# Patient Record
Sex: Male | Born: 2000 | Race: Black or African American | Hispanic: No | Marital: Single | Smoking: Never smoker
Health system: Southern US, Community
[De-identification: ages and names within clinical notes are randomized; demographics above are authoritative.]

## PROBLEM LIST (undated history)

## (undated) ENCOUNTER — Emergency Department (HOSPITAL_BASED_OUTPATIENT_CLINIC_OR_DEPARTMENT_OTHER): Admission: EM | Payer: Self-pay | Source: Home / Self Care

---
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
## 2019-01-08 DIAGNOSIS — M94261 Chondromalacia, right knee: Secondary | ICD-10-CM | POA: Diagnosis not present

## 2019-01-08 DIAGNOSIS — S83271A Complex tear of lateral meniscus, current injury, right knee, initial encounter: Secondary | ICD-10-CM | POA: Diagnosis not present

## 2019-01-14 DIAGNOSIS — Z4789 Encounter for other orthopedic aftercare: Secondary | ICD-10-CM | POA: Diagnosis not present

## 2019-01-16 DIAGNOSIS — Z4789 Encounter for other orthopedic aftercare: Secondary | ICD-10-CM | POA: Diagnosis not present

## 2019-01-23 DIAGNOSIS — Z4789 Encounter for other orthopedic aftercare: Secondary | ICD-10-CM | POA: Diagnosis not present

## 2019-01-25 DIAGNOSIS — Z4789 Encounter for other orthopedic aftercare: Secondary | ICD-10-CM | POA: Diagnosis not present

## 2019-01-29 DIAGNOSIS — Z4789 Encounter for other orthopedic aftercare: Secondary | ICD-10-CM | POA: Diagnosis not present

## 2019-01-31 DIAGNOSIS — Z4789 Encounter for other orthopedic aftercare: Secondary | ICD-10-CM | POA: Diagnosis not present

## 2019-02-05 DIAGNOSIS — Z4789 Encounter for other orthopedic aftercare: Secondary | ICD-10-CM | POA: Diagnosis not present

## 2019-02-07 DIAGNOSIS — Z4789 Encounter for other orthopedic aftercare: Secondary | ICD-10-CM | POA: Diagnosis not present

## 2019-02-12 DIAGNOSIS — Z4789 Encounter for other orthopedic aftercare: Secondary | ICD-10-CM | POA: Diagnosis not present

## 2019-02-14 DIAGNOSIS — Z4789 Encounter for other orthopedic aftercare: Secondary | ICD-10-CM | POA: Diagnosis not present

## 2019-02-19 DIAGNOSIS — Z4789 Encounter for other orthopedic aftercare: Secondary | ICD-10-CM | POA: Diagnosis not present

## 2019-02-21 DIAGNOSIS — Z4789 Encounter for other orthopedic aftercare: Secondary | ICD-10-CM | POA: Diagnosis not present

## 2019-02-26 DIAGNOSIS — Z4789 Encounter for other orthopedic aftercare: Secondary | ICD-10-CM | POA: Diagnosis not present

## 2019-02-28 DIAGNOSIS — Z4789 Encounter for other orthopedic aftercare: Secondary | ICD-10-CM | POA: Diagnosis not present

## 2019-03-05 DIAGNOSIS — Z4789 Encounter for other orthopedic aftercare: Secondary | ICD-10-CM | POA: Diagnosis not present

## 2019-03-07 DIAGNOSIS — Z4789 Encounter for other orthopedic aftercare: Secondary | ICD-10-CM | POA: Diagnosis not present

## 2019-03-12 DIAGNOSIS — Z4789 Encounter for other orthopedic aftercare: Secondary | ICD-10-CM | POA: Diagnosis not present

## 2019-03-14 DIAGNOSIS — Z4789 Encounter for other orthopedic aftercare: Secondary | ICD-10-CM | POA: Diagnosis not present

## 2019-03-19 DIAGNOSIS — Z4789 Encounter for other orthopedic aftercare: Secondary | ICD-10-CM | POA: Diagnosis not present

## 2019-03-21 DIAGNOSIS — Z4789 Encounter for other orthopedic aftercare: Secondary | ICD-10-CM | POA: Diagnosis not present

## 2019-03-28 DIAGNOSIS — Z4789 Encounter for other orthopedic aftercare: Secondary | ICD-10-CM | POA: Diagnosis not present

## 2019-06-04 DIAGNOSIS — Z1159 Encounter for screening for other viral diseases: Secondary | ICD-10-CM | POA: Diagnosis not present

## 2019-06-04 DIAGNOSIS — Z03818 Encounter for observation for suspected exposure to other biological agents ruled out: Secondary | ICD-10-CM | POA: Diagnosis not present

## 2019-08-05 DIAGNOSIS — Z20828 Contact with and (suspected) exposure to other viral communicable diseases: Secondary | ICD-10-CM | POA: Diagnosis not present

## 2019-08-05 DIAGNOSIS — Z4789 Encounter for other orthopedic aftercare: Secondary | ICD-10-CM | POA: Diagnosis not present

## 2019-08-05 DIAGNOSIS — M25561 Pain in right knee: Secondary | ICD-10-CM | POA: Diagnosis not present

## 2019-08-10 DIAGNOSIS — M25561 Pain in right knee: Secondary | ICD-10-CM | POA: Diagnosis not present

## 2019-08-22 DIAGNOSIS — M25561 Pain in right knee: Secondary | ICD-10-CM | POA: Diagnosis not present

## 2019-09-17 DIAGNOSIS — M23203 Derangement of unspecified medial meniscus due to old tear or injury, right knee: Secondary | ICD-10-CM | POA: Diagnosis not present

## 2019-09-17 DIAGNOSIS — M23200 Derangement of unspecified lateral meniscus due to old tear or injury, right knee: Secondary | ICD-10-CM | POA: Diagnosis not present

## 2019-09-18 DIAGNOSIS — M23203 Derangement of unspecified medial meniscus due to old tear or injury, right knee: Secondary | ICD-10-CM | POA: Diagnosis not present

## 2019-09-18 DIAGNOSIS — M23200 Derangement of unspecified lateral meniscus due to old tear or injury, right knee: Secondary | ICD-10-CM | POA: Diagnosis not present

## 2019-09-23 DIAGNOSIS — M23200 Derangement of unspecified lateral meniscus due to old tear or injury, right knee: Secondary | ICD-10-CM | POA: Diagnosis not present

## 2019-09-23 DIAGNOSIS — M23203 Derangement of unspecified medial meniscus due to old tear or injury, right knee: Secondary | ICD-10-CM | POA: Diagnosis not present

## 2019-09-26 DIAGNOSIS — M23200 Derangement of unspecified lateral meniscus due to old tear or injury, right knee: Secondary | ICD-10-CM | POA: Diagnosis not present

## 2019-09-26 DIAGNOSIS — M23203 Derangement of unspecified medial meniscus due to old tear or injury, right knee: Secondary | ICD-10-CM | POA: Diagnosis not present

## 2019-10-03 DIAGNOSIS — M23203 Derangement of unspecified medial meniscus due to old tear or injury, right knee: Secondary | ICD-10-CM | POA: Diagnosis not present

## 2019-10-03 DIAGNOSIS — M23200 Derangement of unspecified lateral meniscus due to old tear or injury, right knee: Secondary | ICD-10-CM | POA: Diagnosis not present

## 2019-10-07 DIAGNOSIS — M23203 Derangement of unspecified medial meniscus due to old tear or injury, right knee: Secondary | ICD-10-CM | POA: Diagnosis not present

## 2019-10-07 DIAGNOSIS — M23200 Derangement of unspecified lateral meniscus due to old tear or injury, right knee: Secondary | ICD-10-CM | POA: Diagnosis not present

## 2019-10-10 DIAGNOSIS — M23200 Derangement of unspecified lateral meniscus due to old tear or injury, right knee: Secondary | ICD-10-CM | POA: Diagnosis not present

## 2019-10-10 DIAGNOSIS — M23203 Derangement of unspecified medial meniscus due to old tear or injury, right knee: Secondary | ICD-10-CM | POA: Diagnosis not present

## 2019-10-15 DIAGNOSIS — M23203 Derangement of unspecified medial meniscus due to old tear or injury, right knee: Secondary | ICD-10-CM | POA: Diagnosis not present

## 2019-10-15 DIAGNOSIS — M23200 Derangement of unspecified lateral meniscus due to old tear or injury, right knee: Secondary | ICD-10-CM | POA: Diagnosis not present

## 2019-10-17 DIAGNOSIS — M23203 Derangement of unspecified medial meniscus due to old tear or injury, right knee: Secondary | ICD-10-CM | POA: Diagnosis not present

## 2019-10-17 DIAGNOSIS — M23200 Derangement of unspecified lateral meniscus due to old tear or injury, right knee: Secondary | ICD-10-CM | POA: Diagnosis not present

## 2019-10-21 DIAGNOSIS — M23200 Derangement of unspecified lateral meniscus due to old tear or injury, right knee: Secondary | ICD-10-CM | POA: Diagnosis not present

## 2019-10-21 DIAGNOSIS — M23203 Derangement of unspecified medial meniscus due to old tear or injury, right knee: Secondary | ICD-10-CM | POA: Diagnosis not present

## 2019-10-24 DIAGNOSIS — M23200 Derangement of unspecified lateral meniscus due to old tear or injury, right knee: Secondary | ICD-10-CM | POA: Diagnosis not present

## 2019-10-24 DIAGNOSIS — M23203 Derangement of unspecified medial meniscus due to old tear or injury, right knee: Secondary | ICD-10-CM | POA: Diagnosis not present

## 2019-10-28 DIAGNOSIS — M23203 Derangement of unspecified medial meniscus due to old tear or injury, right knee: Secondary | ICD-10-CM | POA: Diagnosis not present

## 2019-10-28 DIAGNOSIS — M23200 Derangement of unspecified lateral meniscus due to old tear or injury, right knee: Secondary | ICD-10-CM | POA: Diagnosis not present

## 2019-11-01 DIAGNOSIS — M23203 Derangement of unspecified medial meniscus due to old tear or injury, right knee: Secondary | ICD-10-CM | POA: Diagnosis not present

## 2019-11-01 DIAGNOSIS — Z23 Encounter for immunization: Secondary | ICD-10-CM | POA: Diagnosis not present

## 2019-11-01 DIAGNOSIS — M23200 Derangement of unspecified lateral meniscus due to old tear or injury, right knee: Secondary | ICD-10-CM | POA: Diagnosis not present

## 2019-11-07 DIAGNOSIS — M23203 Derangement of unspecified medial meniscus due to old tear or injury, right knee: Secondary | ICD-10-CM | POA: Diagnosis not present

## 2019-11-07 DIAGNOSIS — M23200 Derangement of unspecified lateral meniscus due to old tear or injury, right knee: Secondary | ICD-10-CM | POA: Diagnosis not present

## 2019-11-12 ENCOUNTER — Ambulatory Visit: Payer: BC Managed Care – PPO | Attending: Internal Medicine

## 2019-11-12 DIAGNOSIS — M23200 Derangement of unspecified lateral meniscus due to old tear or injury, right knee: Secondary | ICD-10-CM | POA: Diagnosis not present

## 2019-11-12 DIAGNOSIS — Z20822 Contact with and (suspected) exposure to covid-19: Secondary | ICD-10-CM | POA: Insufficient documentation

## 2019-11-12 DIAGNOSIS — M23203 Derangement of unspecified medial meniscus due to old tear or injury, right knee: Secondary | ICD-10-CM | POA: Diagnosis not present

## 2019-11-14 LAB — NOVEL CORONAVIRUS, NAA: SARS-CoV-2, NAA: NOT DETECTED

## 2019-11-30 DIAGNOSIS — Z20822 Contact with and (suspected) exposure to covid-19: Secondary | ICD-10-CM | POA: Diagnosis not present

## 2019-12-11 DIAGNOSIS — Z20828 Contact with and (suspected) exposure to other viral communicable diseases: Secondary | ICD-10-CM | POA: Diagnosis not present

## 2019-12-18 DIAGNOSIS — Z20828 Contact with and (suspected) exposure to other viral communicable diseases: Secondary | ICD-10-CM | POA: Diagnosis not present

## 2019-12-25 DIAGNOSIS — Z20828 Contact with and (suspected) exposure to other viral communicable diseases: Secondary | ICD-10-CM | POA: Diagnosis not present

## 2020-01-01 DIAGNOSIS — Z20828 Contact with and (suspected) exposure to other viral communicable diseases: Secondary | ICD-10-CM | POA: Diagnosis not present

## 2020-01-08 DIAGNOSIS — Z20828 Contact with and (suspected) exposure to other viral communicable diseases: Secondary | ICD-10-CM | POA: Diagnosis not present

## 2020-01-15 DIAGNOSIS — Z20828 Contact with and (suspected) exposure to other viral communicable diseases: Secondary | ICD-10-CM | POA: Diagnosis not present

## 2020-04-29 DIAGNOSIS — D485 Neoplasm of uncertain behavior of skin: Secondary | ICD-10-CM | POA: Diagnosis not present

## 2020-04-29 DIAGNOSIS — L738 Other specified follicular disorders: Secondary | ICD-10-CM | POA: Diagnosis not present

## 2020-05-14 DIAGNOSIS — Z0000 Encounter for general adult medical examination without abnormal findings: Secondary | ICD-10-CM | POA: Diagnosis not present

## 2020-06-22 DIAGNOSIS — S76112A Strain of left quadriceps muscle, fascia and tendon, initial encounter: Secondary | ICD-10-CM | POA: Diagnosis not present

## 2020-06-22 DIAGNOSIS — S76119A Strain of unspecified quadriceps muscle, fascia and tendon, initial encounter: Secondary | ICD-10-CM | POA: Diagnosis not present

## 2020-07-13 DIAGNOSIS — S76109A Unspecified injury of unspecified quadriceps muscle, fascia and tendon, initial encounter: Secondary | ICD-10-CM | POA: Diagnosis not present

## 2020-07-13 DIAGNOSIS — M79605 Pain in left leg: Secondary | ICD-10-CM | POA: Diagnosis not present

## 2020-07-17 DIAGNOSIS — Z20828 Contact with and (suspected) exposure to other viral communicable diseases: Secondary | ICD-10-CM | POA: Diagnosis not present

## 2020-07-21 DIAGNOSIS — Z20828 Contact with and (suspected) exposure to other viral communicable diseases: Secondary | ICD-10-CM | POA: Diagnosis not present

## 2020-07-23 DIAGNOSIS — Z20828 Contact with and (suspected) exposure to other viral communicable diseases: Secondary | ICD-10-CM | POA: Diagnosis not present

## 2020-07-27 DIAGNOSIS — Z20828 Contact with and (suspected) exposure to other viral communicable diseases: Secondary | ICD-10-CM | POA: Diagnosis not present

## 2020-10-09 DIAGNOSIS — M25561 Pain in right knee: Secondary | ICD-10-CM | POA: Diagnosis not present

## 2020-10-17 DIAGNOSIS — Z20822 Contact with and (suspected) exposure to covid-19: Secondary | ICD-10-CM | POA: Diagnosis not present

## 2020-10-17 DIAGNOSIS — R059 Cough, unspecified: Secondary | ICD-10-CM | POA: Diagnosis not present

## 2020-10-17 DIAGNOSIS — R509 Fever, unspecified: Secondary | ICD-10-CM | POA: Diagnosis not present

## 2020-10-17 DIAGNOSIS — J029 Acute pharyngitis, unspecified: Secondary | ICD-10-CM | POA: Diagnosis not present

## 2020-11-17 DIAGNOSIS — R0602 Shortness of breath: Secondary | ICD-10-CM | POA: Diagnosis not present

## 2020-11-17 DIAGNOSIS — U071 COVID-19: Secondary | ICD-10-CM | POA: Diagnosis not present

## 2020-11-17 DIAGNOSIS — I517 Cardiomegaly: Secondary | ICD-10-CM | POA: Diagnosis not present

## 2020-11-17 DIAGNOSIS — Z025 Encounter for examination for participation in sport: Secondary | ICD-10-CM | POA: Diagnosis not present

## 2021-01-12 DIAGNOSIS — M25552 Pain in left hip: Secondary | ICD-10-CM | POA: Diagnosis not present

## 2021-01-12 DIAGNOSIS — R102 Pelvic and perineal pain: Secondary | ICD-10-CM | POA: Diagnosis not present

## 2021-01-12 DIAGNOSIS — M25551 Pain in right hip: Secondary | ICD-10-CM | POA: Diagnosis not present

## 2021-01-26 DIAGNOSIS — M25551 Pain in right hip: Secondary | ICD-10-CM | POA: Diagnosis not present

## 2021-01-26 DIAGNOSIS — M25552 Pain in left hip: Secondary | ICD-10-CM | POA: Diagnosis not present

## 2021-01-29 DIAGNOSIS — M25552 Pain in left hip: Secondary | ICD-10-CM | POA: Diagnosis not present

## 2021-01-29 DIAGNOSIS — M25551 Pain in right hip: Secondary | ICD-10-CM | POA: Diagnosis not present

## 2021-02-02 DIAGNOSIS — M25551 Pain in right hip: Secondary | ICD-10-CM | POA: Diagnosis not present

## 2021-02-02 DIAGNOSIS — M25552 Pain in left hip: Secondary | ICD-10-CM | POA: Diagnosis not present

## 2021-02-09 DIAGNOSIS — M25552 Pain in left hip: Secondary | ICD-10-CM | POA: Diagnosis not present

## 2021-02-09 DIAGNOSIS — M25551 Pain in right hip: Secondary | ICD-10-CM | POA: Diagnosis not present

## 2021-02-11 DIAGNOSIS — M25551 Pain in right hip: Secondary | ICD-10-CM | POA: Diagnosis not present

## 2021-02-11 DIAGNOSIS — M25552 Pain in left hip: Secondary | ICD-10-CM | POA: Diagnosis not present

## 2021-02-16 DIAGNOSIS — M25551 Pain in right hip: Secondary | ICD-10-CM | POA: Diagnosis not present

## 2021-02-16 DIAGNOSIS — M25552 Pain in left hip: Secondary | ICD-10-CM | POA: Diagnosis not present

## 2021-02-18 DIAGNOSIS — M25551 Pain in right hip: Secondary | ICD-10-CM | POA: Diagnosis not present

## 2021-02-18 DIAGNOSIS — M25552 Pain in left hip: Secondary | ICD-10-CM | POA: Diagnosis not present

## 2021-02-22 DIAGNOSIS — M25551 Pain in right hip: Secondary | ICD-10-CM | POA: Diagnosis not present

## 2021-02-22 DIAGNOSIS — M25552 Pain in left hip: Secondary | ICD-10-CM | POA: Diagnosis not present

## 2021-02-23 DIAGNOSIS — M25552 Pain in left hip: Secondary | ICD-10-CM | POA: Diagnosis not present

## 2021-02-23 DIAGNOSIS — M25551 Pain in right hip: Secondary | ICD-10-CM | POA: Diagnosis not present

## 2021-03-04 DIAGNOSIS — M25551 Pain in right hip: Secondary | ICD-10-CM | POA: Diagnosis not present

## 2021-03-04 DIAGNOSIS — M25552 Pain in left hip: Secondary | ICD-10-CM | POA: Diagnosis not present

## 2021-03-12 DIAGNOSIS — M25652 Stiffness of left hip, not elsewhere classified: Secondary | ICD-10-CM | POA: Diagnosis not present

## 2021-03-12 DIAGNOSIS — M25551 Pain in right hip: Secondary | ICD-10-CM | POA: Diagnosis not present

## 2021-03-12 DIAGNOSIS — M25552 Pain in left hip: Secondary | ICD-10-CM | POA: Diagnosis not present

## 2021-03-12 DIAGNOSIS — M25651 Stiffness of right hip, not elsewhere classified: Secondary | ICD-10-CM | POA: Diagnosis not present

## 2021-03-16 DIAGNOSIS — M25552 Pain in left hip: Secondary | ICD-10-CM | POA: Diagnosis not present

## 2021-03-16 DIAGNOSIS — M25551 Pain in right hip: Secondary | ICD-10-CM | POA: Diagnosis not present

## 2021-03-16 DIAGNOSIS — M25651 Stiffness of right hip, not elsewhere classified: Secondary | ICD-10-CM | POA: Diagnosis not present

## 2021-03-16 DIAGNOSIS — M25652 Stiffness of left hip, not elsewhere classified: Secondary | ICD-10-CM | POA: Diagnosis not present

## 2021-03-18 DIAGNOSIS — M25552 Pain in left hip: Secondary | ICD-10-CM | POA: Diagnosis not present

## 2021-03-18 DIAGNOSIS — M25652 Stiffness of left hip, not elsewhere classified: Secondary | ICD-10-CM | POA: Diagnosis not present

## 2021-03-18 DIAGNOSIS — M25651 Stiffness of right hip, not elsewhere classified: Secondary | ICD-10-CM | POA: Diagnosis not present

## 2021-03-18 DIAGNOSIS — M25551 Pain in right hip: Secondary | ICD-10-CM | POA: Diagnosis not present

## 2021-03-23 DIAGNOSIS — M25551 Pain in right hip: Secondary | ICD-10-CM | POA: Diagnosis not present

## 2021-03-23 DIAGNOSIS — M25652 Stiffness of left hip, not elsewhere classified: Secondary | ICD-10-CM | POA: Diagnosis not present

## 2021-03-23 DIAGNOSIS — M25552 Pain in left hip: Secondary | ICD-10-CM | POA: Diagnosis not present

## 2021-03-23 DIAGNOSIS — M25651 Stiffness of right hip, not elsewhere classified: Secondary | ICD-10-CM | POA: Diagnosis not present

## 2021-03-25 DIAGNOSIS — M25651 Stiffness of right hip, not elsewhere classified: Secondary | ICD-10-CM | POA: Diagnosis not present

## 2021-03-25 DIAGNOSIS — M25552 Pain in left hip: Secondary | ICD-10-CM | POA: Diagnosis not present

## 2021-03-25 DIAGNOSIS — M25652 Stiffness of left hip, not elsewhere classified: Secondary | ICD-10-CM | POA: Diagnosis not present

## 2021-03-25 DIAGNOSIS — M25551 Pain in right hip: Secondary | ICD-10-CM | POA: Diagnosis not present

## 2021-03-30 DIAGNOSIS — M25551 Pain in right hip: Secondary | ICD-10-CM | POA: Diagnosis not present

## 2021-03-30 DIAGNOSIS — M25651 Stiffness of right hip, not elsewhere classified: Secondary | ICD-10-CM | POA: Diagnosis not present

## 2021-03-30 DIAGNOSIS — M25652 Stiffness of left hip, not elsewhere classified: Secondary | ICD-10-CM | POA: Diagnosis not present

## 2021-03-30 DIAGNOSIS — M25552 Pain in left hip: Secondary | ICD-10-CM | POA: Diagnosis not present

## 2021-04-06 DIAGNOSIS — M25651 Stiffness of right hip, not elsewhere classified: Secondary | ICD-10-CM | POA: Diagnosis not present

## 2021-04-06 DIAGNOSIS — M25551 Pain in right hip: Secondary | ICD-10-CM | POA: Diagnosis not present

## 2021-04-06 DIAGNOSIS — M25652 Stiffness of left hip, not elsewhere classified: Secondary | ICD-10-CM | POA: Diagnosis not present

## 2021-04-06 DIAGNOSIS — M25552 Pain in left hip: Secondary | ICD-10-CM | POA: Diagnosis not present

## 2021-04-08 DIAGNOSIS — M25651 Stiffness of right hip, not elsewhere classified: Secondary | ICD-10-CM | POA: Diagnosis not present

## 2021-04-08 DIAGNOSIS — M25551 Pain in right hip: Secondary | ICD-10-CM | POA: Diagnosis not present

## 2021-04-08 DIAGNOSIS — M25652 Stiffness of left hip, not elsewhere classified: Secondary | ICD-10-CM | POA: Diagnosis not present

## 2021-04-08 DIAGNOSIS — M25552 Pain in left hip: Secondary | ICD-10-CM | POA: Diagnosis not present

## 2021-04-13 DIAGNOSIS — M25552 Pain in left hip: Secondary | ICD-10-CM | POA: Diagnosis not present

## 2021-04-13 DIAGNOSIS — M25551 Pain in right hip: Secondary | ICD-10-CM | POA: Diagnosis not present

## 2021-04-13 DIAGNOSIS — M25652 Stiffness of left hip, not elsewhere classified: Secondary | ICD-10-CM | POA: Diagnosis not present

## 2021-04-13 DIAGNOSIS — M25651 Stiffness of right hip, not elsewhere classified: Secondary | ICD-10-CM | POA: Diagnosis not present

## 2021-04-15 DIAGNOSIS — M25651 Stiffness of right hip, not elsewhere classified: Secondary | ICD-10-CM | POA: Diagnosis not present

## 2021-04-15 DIAGNOSIS — M25551 Pain in right hip: Secondary | ICD-10-CM | POA: Diagnosis not present

## 2021-04-15 DIAGNOSIS — M25552 Pain in left hip: Secondary | ICD-10-CM | POA: Diagnosis not present

## 2021-04-15 DIAGNOSIS — M25652 Stiffness of left hip, not elsewhere classified: Secondary | ICD-10-CM | POA: Diagnosis not present

## 2021-04-16 DIAGNOSIS — M25551 Pain in right hip: Secondary | ICD-10-CM | POA: Diagnosis not present

## 2021-05-10 DIAGNOSIS — R102 Pelvic and perineal pain: Secondary | ICD-10-CM | POA: Diagnosis not present

## 2021-05-19 DIAGNOSIS — M25552 Pain in left hip: Secondary | ICD-10-CM | POA: Diagnosis not present

## 2021-05-21 DIAGNOSIS — M25552 Pain in left hip: Secondary | ICD-10-CM | POA: Diagnosis not present

## 2021-05-21 DIAGNOSIS — M25652 Stiffness of left hip, not elsewhere classified: Secondary | ICD-10-CM | POA: Diagnosis not present

## 2021-05-21 DIAGNOSIS — M25551 Pain in right hip: Secondary | ICD-10-CM | POA: Diagnosis not present

## 2021-05-21 DIAGNOSIS — M25651 Stiffness of right hip, not elsewhere classified: Secondary | ICD-10-CM | POA: Diagnosis not present

## 2021-05-25 DIAGNOSIS — M25551 Pain in right hip: Secondary | ICD-10-CM | POA: Diagnosis not present

## 2021-05-25 DIAGNOSIS — M25651 Stiffness of right hip, not elsewhere classified: Secondary | ICD-10-CM | POA: Diagnosis not present

## 2021-05-25 DIAGNOSIS — M25552 Pain in left hip: Secondary | ICD-10-CM | POA: Diagnosis not present

## 2021-05-25 DIAGNOSIS — M25652 Stiffness of left hip, not elsewhere classified: Secondary | ICD-10-CM | POA: Diagnosis not present

## 2021-11-23 DIAGNOSIS — K219 Gastro-esophageal reflux disease without esophagitis: Secondary | ICD-10-CM | POA: Diagnosis not present

## 2022-06-01 DIAGNOSIS — M25562 Pain in left knee: Secondary | ICD-10-CM | POA: Diagnosis not present

## 2022-08-01 DIAGNOSIS — H16291 Other keratoconjunctivitis, right eye: Secondary | ICD-10-CM | POA: Diagnosis not present

## 2022-08-01 DIAGNOSIS — H18211 Corneal edema secondary to contact lens, right eye: Secondary | ICD-10-CM | POA: Diagnosis not present

## 2022-08-08 DIAGNOSIS — H16291 Other keratoconjunctivitis, right eye: Secondary | ICD-10-CM | POA: Diagnosis not present

## 2022-08-08 DIAGNOSIS — H18211 Corneal edema secondary to contact lens, right eye: Secondary | ICD-10-CM | POA: Diagnosis not present

## 2023-02-26 DIAGNOSIS — R0789 Other chest pain: Secondary | ICD-10-CM | POA: Diagnosis not present

## 2023-05-01 DIAGNOSIS — Z23 Encounter for immunization: Secondary | ICD-10-CM | POA: Diagnosis not present

## 2023-05-01 DIAGNOSIS — Z0000 Encounter for general adult medical examination without abnormal findings: Secondary | ICD-10-CM | POA: Diagnosis not present

## 2023-11-26 DIAGNOSIS — J101 Influenza due to other identified influenza virus with other respiratory manifestations: Secondary | ICD-10-CM | POA: Diagnosis not present

## 2023-11-26 DIAGNOSIS — J020 Streptococcal pharyngitis: Secondary | ICD-10-CM | POA: Diagnosis not present

## 2024-02-21 DIAGNOSIS — D485 Neoplasm of uncertain behavior of skin: Secondary | ICD-10-CM | POA: Diagnosis not present

## 2024-02-21 DIAGNOSIS — B078 Other viral warts: Secondary | ICD-10-CM | POA: Diagnosis not present

## 2024-03-28 DIAGNOSIS — J029 Acute pharyngitis, unspecified: Secondary | ICD-10-CM | POA: Diagnosis not present

## 2024-03-28 DIAGNOSIS — R059 Cough, unspecified: Secondary | ICD-10-CM | POA: Diagnosis not present

## 2024-03-28 DIAGNOSIS — U071 COVID-19: Secondary | ICD-10-CM | POA: Diagnosis not present

## 2024-08-09 DIAGNOSIS — Z113 Encounter for screening for infections with a predominantly sexual mode of transmission: Secondary | ICD-10-CM | POA: Diagnosis not present

## 2024-08-15 DIAGNOSIS — Z113 Encounter for screening for infections with a predominantly sexual mode of transmission: Secondary | ICD-10-CM | POA: Diagnosis not present

## 2024-10-04 DIAGNOSIS — Z113 Encounter for screening for infections with a predominantly sexual mode of transmission: Secondary | ICD-10-CM | POA: Diagnosis not present

## 2024-10-08 DIAGNOSIS — Z113 Encounter for screening for infections with a predominantly sexual mode of transmission: Secondary | ICD-10-CM | POA: Diagnosis not present
